# Patient Record
Sex: Male | Born: 2000 | Race: White | Hispanic: No | Marital: Single | Smoking: Never smoker
Health system: Southern US, Community
[De-identification: ages and names within clinical notes are randomized; demographics above are authoritative.]

## PROBLEM LIST (undated history)

## (undated) DIAGNOSIS — I82409 Acute embolism and thrombosis of unspecified deep veins of unspecified lower extremity: Secondary | ICD-10-CM

## (undated) HISTORY — DX: Acute embolism and thrombosis of unspecified deep veins of unspecified lower extremity: I82.409

## (undated) HISTORY — PX: MOUTH SURGERY: SHX715

---
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
## 2018-04-22 DIAGNOSIS — M5416 Radiculopathy, lumbar region: Secondary | ICD-10-CM | POA: Insufficient documentation

## 2018-04-29 ENCOUNTER — Other Ambulatory Visit: Payer: Self-pay | Admitting: Orthopedic Surgery

## 2018-04-29 DIAGNOSIS — R2242 Localized swelling, mass and lump, left lower limb: Secondary | ICD-10-CM

## 2018-04-30 ENCOUNTER — Other Ambulatory Visit: Payer: Self-pay

## 2018-04-30 ENCOUNTER — Ambulatory Visit: Admission: RE | Admit: 2018-04-30 | Payer: BLUE CROSS/BLUE SHIELD | Source: Ambulatory Visit

## 2018-04-30 ENCOUNTER — Inpatient Hospital Stay
Admission: EM | Admit: 2018-04-30 | Discharge: 2018-05-01 | DRG: 271 | Disposition: A | Discharge status: Home / Self Care | Payer: Medicaid Other | Attending: Family Medicine | Admitting: Family Medicine

## 2018-04-30 ENCOUNTER — Inpatient Hospital Stay: Payer: Medicaid Other

## 2018-04-30 ENCOUNTER — Encounter
Admission: EM | Disposition: A | Discharge status: Home / Self Care | Payer: Self-pay | Source: Home / Self Care | Attending: Family Medicine

## 2018-04-30 ENCOUNTER — Inpatient Hospital Stay (HOSPITAL_COMMUNITY)
Admission: EM | Admit: 2018-04-30 | Discharge: 2018-04-30 | Disposition: A | Discharge status: Home / Self Care | Payer: Medicaid Other | Source: Home / Self Care | Attending: Internal Medicine | Admitting: Internal Medicine

## 2018-04-30 DIAGNOSIS — D72829 Elevated white blood cell count, unspecified: Secondary | ICD-10-CM | POA: Diagnosis present

## 2018-04-30 DIAGNOSIS — K529 Noninfective gastroenteritis and colitis, unspecified: Secondary | ICD-10-CM | POA: Diagnosis present

## 2018-04-30 DIAGNOSIS — E663 Overweight: Secondary | ICD-10-CM | POA: Diagnosis present

## 2018-04-30 DIAGNOSIS — Z8249 Family history of ischemic heart disease and other diseases of the circulatory system: Secondary | ICD-10-CM

## 2018-04-30 DIAGNOSIS — M79662 Pain in left lower leg: Secondary | ICD-10-CM

## 2018-04-30 DIAGNOSIS — Z818 Family history of other mental and behavioral disorders: Secondary | ICD-10-CM

## 2018-04-30 DIAGNOSIS — Z7901 Long term (current) use of anticoagulants: Secondary | ICD-10-CM

## 2018-04-30 DIAGNOSIS — Z825 Family history of asthma and other chronic lower respiratory diseases: Secondary | ICD-10-CM | POA: Diagnosis not present

## 2018-04-30 DIAGNOSIS — I82412 Acute embolism and thrombosis of left femoral vein: Secondary | ICD-10-CM | POA: Diagnosis present

## 2018-04-30 DIAGNOSIS — I745 Embolism and thrombosis of iliac artery: Secondary | ICD-10-CM | POA: Diagnosis present

## 2018-04-30 DIAGNOSIS — M7989 Other specified soft tissue disorders: Secondary | ICD-10-CM

## 2018-04-30 DIAGNOSIS — Z821 Family history of blindness and visual loss: Secondary | ICD-10-CM | POA: Diagnosis not present

## 2018-04-30 DIAGNOSIS — I82419 Acute embolism and thrombosis of unspecified femoral vein: Secondary | ICD-10-CM | POA: Diagnosis present

## 2018-04-30 DIAGNOSIS — I82422 Acute embolism and thrombosis of left iliac vein: Secondary | ICD-10-CM

## 2018-04-30 DIAGNOSIS — I340 Nonrheumatic mitral (valve) insufficiency: Secondary | ICD-10-CM

## 2018-04-30 DIAGNOSIS — M79605 Pain in left leg: Secondary | ICD-10-CM

## 2018-04-30 DIAGNOSIS — Z833 Family history of diabetes mellitus: Secondary | ICD-10-CM

## 2018-04-30 DIAGNOSIS — R600 Localized edema: Secondary | ICD-10-CM

## 2018-04-30 DIAGNOSIS — I82439 Acute embolism and thrombosis of unspecified popliteal vein: Secondary | ICD-10-CM | POA: Diagnosis present

## 2018-04-30 DIAGNOSIS — Z823 Family history of stroke: Secondary | ICD-10-CM | POA: Diagnosis not present

## 2018-04-30 DIAGNOSIS — I82432 Acute embolism and thrombosis of left popliteal vein: Secondary | ICD-10-CM

## 2018-04-30 DIAGNOSIS — I82409 Acute embolism and thrombosis of unspecified deep veins of unspecified lower extremity: Secondary | ICD-10-CM | POA: Diagnosis present

## 2018-04-30 HISTORY — PX: PERIPHERAL VASCULAR THROMBECTOMY: CATH118306

## 2018-04-30 LAB — CBC WITH DIFFERENTIAL/PLATELET
ABS IMMATURE GRANULOCYTES: 0.05 10*3/uL (ref 0.00–0.07)
BASOS ABS: 0 10*3/uL (ref 0.0–0.1)
Basophils Relative: 0 %
Eosinophils Absolute: 0.2 10*3/uL (ref 0.0–0.5)
Eosinophils Relative: 2 %
HEMATOCRIT: 46.7 % (ref 39.0–52.0)
Hemoglobin: 15.5 g/dL (ref 13.0–17.0)
IMMATURE GRANULOCYTES: 1 %
LYMPHS ABS: 1.6 10*3/uL (ref 0.7–4.0)
LYMPHS PCT: 15 %
MCH: 28.1 pg (ref 26.0–34.0)
MCHC: 33.2 g/dL (ref 30.0–36.0)
MCV: 84.8 fL (ref 80.0–100.0)
MONOS PCT: 8 %
Monocytes Absolute: 0.9 10*3/uL (ref 0.1–1.0)
NEUTROS ABS: 8.1 10*3/uL — AB (ref 1.7–7.7)
NEUTROS PCT: 74 %
NRBC: 0 % (ref 0.0–0.2)
Platelets: 283 10*3/uL (ref 150–400)
RBC: 5.51 MIL/uL (ref 4.22–5.81)
RDW: 13.2 % (ref 11.5–15.5)
WBC: 10.8 10*3/uL — ABNORMAL HIGH (ref 4.0–10.5)

## 2018-04-30 LAB — COMPREHENSIVE METABOLIC PANEL
ALBUMIN: 4.4 g/dL (ref 3.5–5.0)
ALK PHOS: 83 U/L (ref 38–126)
ALT: 14 U/L (ref 0–44)
ANION GAP: 8 (ref 5–15)
AST: 16 U/L (ref 15–41)
BUN: 18 mg/dL (ref 6–20)
CHLORIDE: 106 mmol/L (ref 98–111)
CO2: 26 mmol/L (ref 22–32)
Calcium: 9 mg/dL (ref 8.9–10.3)
Creatinine, Ser: 0.66 mg/dL (ref 0.61–1.24)
GFR calc Af Amer: >60 mL/min (ref >60)
GFR calc non Af Amer: >60 mL/min (ref >60)
GLUCOSE: 103 mg/dL — AB (ref 70–99)
POTASSIUM: 3.6 mmol/L (ref 3.5–5.1)
SODIUM: 140 mmol/L (ref 135–145)
Total Bilirubin: 1 mg/dL (ref 0.3–1.2)
Total Protein: 8.3 g/dL — ABNORMAL HIGH (ref 6.5–8.1)

## 2018-04-30 LAB — HEMOGLOBIN A1C
Hgb A1c MFr Bld: 5 % (ref 4.8–5.6)
Mean Plasma Glucose: 96.8 mg/dL

## 2018-04-30 LAB — HEPARIN LEVEL (UNFRACTIONATED)
HEPARIN UNFRACTIONATED: 0.19 [IU]/mL — AB (ref 0.30–0.70)
HEPARIN UNFRACTIONATED: 0.31 [IU]/mL (ref 0.30–0.70)

## 2018-04-30 LAB — PROTIME-INR
INR: 1.1
PROTHROMBIN TIME: 14.1 s (ref 11.4–15.2)

## 2018-04-30 LAB — APTT: aPTT: 31 seconds (ref 24–36)

## 2018-04-30 LAB — TSH: TSH: 5.731 u[IU]/mL — ABNORMAL HIGH (ref 0.350–4.500)

## 2018-04-30 SURGERY — PERIPHERAL VASCULAR THROMBECTOMY
Anesthesia: Moderate Sedation | Laterality: Left

## 2018-04-30 MED ORDER — ONDANSETRON HCL 4 MG PO TABS: 4.0000 mg | ORAL_TABLET | Freq: Four times a day (QID) | ORAL | Status: DC | PRN

## 2018-04-30 MED ORDER — FENTANYL CITRATE (PF) 100 MCG/2ML IJ SOLN
INTRAMUSCULAR | Status: DC | PRN
  Administered 2018-04-30 (×2): 50 ug via INTRAVENOUS
  Administered 2018-04-30: 25 ug via INTRAVENOUS
  Administered 2018-04-30: 50 ug via INTRAVENOUS
  Administered 2018-04-30: 25 ug via INTRAVENOUS

## 2018-04-30 MED ORDER — APIXABAN 5 MG PO TABS
10.0000 mg | ORAL_TABLET | Freq: Two times a day (BID) | ORAL | Status: DC
  Administered 2018-05-01: 10 mg via ORAL
  Filled 2018-04-30: qty 2

## 2018-04-30 MED ORDER — MIDAZOLAM HCL 2 MG/2ML IJ SOLN
INTRAMUSCULAR | Status: DC | PRN
  Administered 2018-04-30 (×2): 1 mg via INTRAVENOUS
  Administered 2018-04-30: 2 mg via INTRAVENOUS
  Administered 2018-04-30: 1 mg via INTRAVENOUS

## 2018-04-30 MED ORDER — FENTANYL CITRATE (PF) 100 MCG/2ML IJ SOLN
INTRAMUSCULAR | Status: AC
  Filled 2018-04-30: qty 2

## 2018-04-30 MED ORDER — ONDANSETRON HCL 4 MG/2ML IJ SOLN
INTRAMUSCULAR | Status: AC
  Administered 2018-04-30: 4 mg via INTRAVENOUS
  Filled 2018-04-30: qty 2

## 2018-04-30 MED ORDER — CEFAZOLIN SODIUM-DEXTROSE 1-4 GM/50ML-% IV SOLN
INTRAVENOUS | Status: AC
  Administered 2018-04-30: 1 g via INTRAVENOUS
  Filled 2018-04-30: qty 50

## 2018-04-30 MED ORDER — ONDANSETRON HCL 4 MG/2ML IJ SOLN: 4.0000 mg | Freq: Four times a day (QID) | INTRAMUSCULAR | Status: DC | PRN

## 2018-04-30 MED ORDER — DOCUSATE SODIUM 100 MG PO CAPS
100.0000 mg | ORAL_CAPSULE | Freq: Two times a day (BID) | ORAL | Status: DC
  Administered 2018-04-30 – 2018-05-01 (×2): 100 mg via ORAL
  Filled 2018-04-30 (×2): qty 1

## 2018-04-30 MED ORDER — MIDAZOLAM HCL 5 MG/5ML IJ SOLN
INTRAMUSCULAR | Status: AC
  Filled 2018-04-30: qty 5

## 2018-04-30 MED ORDER — OXYCODONE-ACETAMINOPHEN 5-325 MG PO TABS
ORAL_TABLET | ORAL | Status: AC
  Filled 2018-04-30: qty 1

## 2018-04-30 MED ORDER — CEFAZOLIN SODIUM-DEXTROSE 2-4 GM/100ML-% IV SOLN
2.0000 g | INTRAVENOUS | Status: DC
  Filled 2018-04-30: qty 100

## 2018-04-30 MED ORDER — ACETAMINOPHEN 325 MG PO TABS
650.0000 mg | ORAL_TABLET | Freq: Four times a day (QID) | ORAL | Status: DC | PRN
  Administered 2018-04-30 – 2018-05-01 (×2): 650 mg via ORAL
  Filled 2018-04-30 (×2): qty 2

## 2018-04-30 MED ORDER — SODIUM CHLORIDE 0.9 % IV SOLN: INTRAVENOUS | Status: DC

## 2018-04-30 MED ORDER — IOPAMIDOL (ISOVUE-300) INJECTION 61%
INTRAVENOUS | Status: DC | PRN
  Administered 2018-04-30: 50 mL via INTRA_ARTERIAL

## 2018-04-30 MED ORDER — HEPARIN (PORCINE) IN NACL 1000-0.9 UT/500ML-% IV SOLN
INTRAVENOUS | Status: AC
  Filled 2018-04-30: qty 1000

## 2018-04-30 MED ORDER — OXYCODONE-ACETAMINOPHEN 5-325 MG PO TABS
1.0000 | ORAL_TABLET | Freq: Four times a day (QID) | ORAL | Status: DC | PRN
  Administered 2018-04-30 – 2018-05-01 (×3): 1 via ORAL
  Filled 2018-04-30 (×2): qty 1

## 2018-04-30 MED ORDER — ACETAMINOPHEN 650 MG RE SUPP: 650.0000 mg | Freq: Four times a day (QID) | RECTAL | Status: DC | PRN

## 2018-04-30 MED ORDER — ONDANSETRON HCL 4 MG/2ML IJ SOLN
4.0000 mg | Freq: Once | INTRAMUSCULAR | Status: AC
  Administered 2018-04-30: 4 mg via INTRAVENOUS

## 2018-04-30 MED ORDER — MORPHINE SULFATE (PF) 4 MG/ML IV SOLN
8.0000 mg | Freq: Once | INTRAVENOUS | Status: AC
  Administered 2018-04-30: 8 mg via INTRAVENOUS

## 2018-04-30 MED ORDER — HEPARIN BOLUS VIA INFUSION
5700.0000 [IU] | Freq: Once | INTRAVENOUS | Status: AC
  Administered 2018-04-30: 5700 [IU] via INTRAVENOUS
  Filled 2018-04-30: qty 5700

## 2018-04-30 MED ORDER — MORPHINE SULFATE (PF) 2 MG/ML IV SOLN
2.0000 mg | INTRAVENOUS | Status: DC | PRN
  Administered 2018-04-30 – 2018-05-01 (×2): 2 mg via INTRAVENOUS
  Filled 2018-04-30 (×2): qty 1

## 2018-04-30 MED ORDER — SODIUM CHLORIDE 0.9 % IV SOLN
INTRAVENOUS | Status: DC
  Administered 2018-04-30 – 2018-05-01 (×3): via INTRAVENOUS

## 2018-04-30 MED ORDER — STERILE WATER FOR INJECTION IJ SOLN
INTRAMUSCULAR | Status: AC
  Filled 2018-04-30: qty 20

## 2018-04-30 MED ORDER — LIDOCAINE-EPINEPHRINE (PF) 1 %-1:200000 IJ SOLN
INTRAMUSCULAR | Status: AC
  Filled 2018-04-30: qty 30

## 2018-04-30 MED ORDER — MORPHINE SULFATE (PF) 4 MG/ML IV SOLN
INTRAVENOUS | Status: AC
  Administered 2018-04-30: 8 mg via INTRAVENOUS
  Filled 2018-04-30: qty 1

## 2018-04-30 MED ORDER — HEPARIN (PORCINE) 25000 UT/250ML-% IV SOLN
1600.0000 [IU]/h | INTRAVENOUS | Status: DC
  Administered 2018-04-30 (×2): 1600 [IU]/h via INTRAVENOUS
  Filled 2018-04-30 (×3): qty 250

## 2018-04-30 MED ORDER — INFLUENZA VAC SPLIT QUAD 0.5 ML IM SUSY: 0.5000 mL | PREFILLED_SYRINGE | INTRAMUSCULAR | Status: DC

## 2018-04-30 MED ORDER — HEPARIN SODIUM (PORCINE) 1000 UNIT/ML IJ SOLN
INTRAMUSCULAR | Status: AC
  Filled 2018-04-30: qty 1

## 2018-04-30 MED ORDER — CEFAZOLIN SODIUM-DEXTROSE 1-4 GM/50ML-% IV SOLN
1.0000 g | INTRAVENOUS | Status: AC
  Administered 2018-04-30: 1 g via INTRAVENOUS

## 2018-04-30 SURGICAL SUPPLY — 17 items
BALLN ULTRVRSE 8X100X75 (BALLOONS) ×3
BALLOON ULTRVRSE 8X100X75 (BALLOONS) ×1 IMPLANT
CANISTER PENUMBRA ENGINE (MISCELLANEOUS) ×3 IMPLANT
CANNULA 5F STIFF (CANNULA) ×3 IMPLANT
CATH BEACON 5 .038 100 VERT TP (CATHETERS) ×3 IMPLANT
CATH INDIGO CAT8 TORQ 85 KIT (CATHETERS) ×3 IMPLANT
CATH INDIGO SEP 8 (CATHETERS) ×3 IMPLANT
DEVICE PRESTO INFLATION (MISCELLANEOUS) ×3 IMPLANT
DEVICE SAFEGUARD 24CM (GAUZE/BANDAGES/DRESSINGS) ×3 IMPLANT
GLIDEWIRE ADV .035X180CM (WIRE) ×3 IMPLANT
PACK ANGIOGRAPHY (CUSTOM PROCEDURE TRAY) ×3 IMPLANT
SHEATH 9FRX11 (SHEATH) ×3 IMPLANT
SHEATH BRITE TIP 8FRX11 (SHEATH) ×3 IMPLANT
SYR MEDRAD MARK V 150ML (SYRINGE) ×3 IMPLANT
TUBING CONTRAST HIGH PRESS 72 (TUBING) ×3 IMPLANT
WIRE J 3MM .035X145CM (WIRE) ×3 IMPLANT
WIRE MAGIC TORQUE 260C (WIRE) ×3 IMPLANT

## 2018-04-30 NOTE — Progress Notes (Signed)
ANTICOAGULATION CONSULT NOTE - Initial Consult  Pharmacy Consult for heparin drip Indication: DVT  No Known Allergies  Patient Measurements: Height: 6' (182.9 cm) Weight: 217 lb 1.6 oz (98.5 kg) IBW/kg (Calculated) : 77.6 Heparin Dosing Weight: 95 kg  Vital Signs: Temp: 98.8 F (37.1 C) (11/14 1941) Temp Source: Oral (11/14 1941) BP: 109/56 (11/14 1941) Pulse Rate: 92 (11/14 1941)  Labs: Recent Labs    04/30/18 0412 04/30/18 0457 04/30/18 1521 04/30/18 2008  HGB 15.5  --   --   --   HCT 46.7  --   --   --   PLT 283  --   --   --   APTT  --  31  --   --   LABPROT  --  14.1  --   --   INR  --  1.10  --   --   HEPARINUNFRC  --   --  0.19* 0.31  CREATININE 0.66  --   --   --     Estimated Creatinine Clearance: 180.7 mL/min (by C-G formula based on SCr of 0.66 mg/dL).   Medical History: History reviewed. No pertinent past medical history.  Medications:  No anticoag in PTA meds.  Assessment: Heparin was stopped at 11/14 @1244 . Heparin level returned at 0.19. Heparin gtt was restarted at 11/14 @1350 . Heparin level was obtained 1521. Level was drawn too early to make adjustments. Currently running at a rate of 1600 units/hr.   11/14 @2008 : Heparin level 0.31.   Goal of Therapy:  Heparin level 0.3-0.7 units/ml Monitor platelets by anticoagulation protocol: Yes   Plan:  Within goal. Will continue with the current rate.  Will order a 6 hour heparin level 6 hours post heparin gtt starting and adjust from there.   Ronnald RampKishan S Avanthika Dehnert, PharmD  Clinical Pharmacist 04/30/2018,8:39 PM

## 2018-04-30 NOTE — Op Note (Signed)
[facility]   OPERATIVE NOTE   PRE-OPERATIVE DIAGNOSIS: extensive Left leg DVT, colitis, previous trauma to the left leg many years ago  POST-OPERATIVE DIAGNOSIS: same   PROCEDURE: 1. US guidance for vascular access to right femoral vein and left lesser saphenous vein 2. Catheter placement into right iliac vein from right femoral approach and into left iliac vein from left lesser saphenous approach 3. IVC gram and bilateral lower extremity venogram 4.   Catheter directed thrombolysis with 12 mg of TPA to the left popliteal, superficial femoral, common femoral, and iliac veins  6. Mechanical thrombectomy to the left popliteal, superficial femoral, common femoral, and iliac veins 7. PTA of the left iliac vein with 8 mm diameter angioplasty balloon   SURGEON: Leotis Pain, MD  ASSISTANT(S): none  ANESTHESIA: local with moderate conscious sedation for 60 minutes using 5 mg of Versed and 200 Mcg of Fentanyl  ESTIMATED BLOOD LOSS: 500 cc  FINDING(S): 1. 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  There did appear to be a somewhat small IVC when injections were performed through the right femoral sheath but this was not well seen from the left.  Extensive thrombus from the occluded left iliac vein all the way down to the left popliteal vein.  SPECIMEN(S): none  INDICATIONS:  Patient is a 17 y.o. male who presents with massive left leg swelling and pain and an extensive left lower extremity DVT. Patient has marked leg swelling and pain. Venous intervention is performed to reduce the symtpoms and avoid long term postphlebitic symptoms.   DESCRIPTION: After obtaining full informed written consent, the patient was brought back to the vascular suite and placed supine upon the table.Moderate conscious sedation was administered during a face to face encounter with the patient throughout the procedure with my supervision of  the RN administering medicines and monitoring the patient's vital signs, pulse oximetry, telemetry and mental status throughout from the start of the procedure until the patient was taken to the recovery room. After obtaining adequate anesthesia, the patient was prepped and draped in the standard fashion. The right femoral vein was then accessed under US guidance and found to be widely patent. It was accessed without difficulty and a permanent image was recorded. I then tried to advance a wire but it would not pass beyond what would appear to be the proximal right common iliac vein.  Imaging performed through the right femoral sheath demonstrated what appeared to be occlusion of the right iliac vein with extensive collaterals.  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  There was not a direct action from the right iliac vein to this apparent IVC.  For this reason, no IVC filter was placed. The patient was then placed into the prone position. The left lesser saphenous vein was then accessed under direct ultrasound guidance without difficulty with a micropuncture needle and a permanent image was recorded. I then upsized to an 9Fr sheath over a J wire. Imaging showed extensive DVT with minimal flow. A Kumpe catheter and Magic tourque wire were then advanced into the CFV and images were performed. There was clearly occlusion of the iliac vein with essentially no forward flow on initial imaging. I then used the penumbra cat 8 catheter and instilled 12 mg of tpa throughout the left popliteal, superficial femoral, common femoral, and iliac veins.  After this dwelled, I  used the Penumbra Cat 8 catheter and evacuated about 250 to 300 cc of effluent with mechanical thrombectomy throughout the iliac veins, CFV, SFV, and popliteal vein. Large amounts of thrombus were removed with this.  This had mild improvement in the forward flow  and significant amount of thrombus remained. I then made several more passes with the penumbra cat 8 catheter using the separator pulling back a large amount of thrombus.  Another 200 to 250 cc of effluent were returned in the return of clot was a very large amount.  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  However, we really did not see the IVC particularly well.  Using an advantage wire and a Kumpe catheter advanced up to what we had the appearance of the IVC, but really did not have much flow when injections were performed through the Kumpe catheter.  I elected to gently treat the left common iliac vein occlusion with an 8 mm diameter by 10 cm length angioplasty balloon inflated to 8 atm for 1 minute.  This did result in some forward flow although it was not really into the IVC and more through pelvic collaterals.  The amount of thrombus returned had been dramatic and I felt we had done as much as we could at this setting to improve his venous circulation.  I then elected to terminate the procedure. The sheath was removed and a dressing was placed. He was taken to the recovery room in stable condition having tolerated the procedure well.   COMPLICATIONS: None  CONDITION: Stable  Leotis Pain 04/30/2018 1:48 PM

## 2018-04-30 NOTE — Consult Note (Signed)
[facility] Vascular Consult Note  MRN : [id number]  Corey Malone. is a 17 y.o. (2001/05/20) male who presents with chief complaint of No chief complaint on file.  History of Present Illness:  The patient is a 17 year old male with no significant past medical history who presented to the [facility] emergency department with a chief complaint of left lower extremity "pain and swelling".  The patient endorses a progressively worsening 2 to 3-day.  Of increasing swelling and discomfort to the left lower extremity.  The patient describes his pain as a "throbbing" which is made worse when standing or ambulating.  The patient denies any recent trauma to the left lower extremity.  He does endorse a history of trauma to the left leg approximately 8 years ago however has not experienced anything recently.  The patient denies any erythema or ulceration to the left lower extremity.  The patient denies any pallor or paresthesia to the left lower extremity.  Patient denies any shortness of breath or chest pain.  Patient denies any fever, nausea vomiting.  During his work-up in the emergency department the patient underwent a left lower extremity venous duplex which was notable for "Extensive deep venous thrombosis throughout the LEFT lower extremity."  Vascular surgery was consulted by Dr. Sheryle Hail in regard to further recommendations including the possibility of venous lysis. Current Facility-Administered Medications  Medication Dose Route Frequency Provider Last Rate Last Dose  . 0.9 %  sodium chloride infusion   Intravenous Continuous Arnaldo Natal, MD 100 mL/hr at 04/30/18 [id number]    . 0.9 %  sodium chloride infusion   Intravenous Continuous Dew, Marlow Baars, MD      . Mitzi Hansen Hold] acetaminophen (TYLENOL) tablet 650 mg  650 mg Oral Q6H PRN Arnaldo Natal, MD       Or  . Mitzi Hansen Hold] acetaminophen (TYLENOL) suppository 650 mg  650 mg Rectal Q6H PRN  Arnaldo Natal, MD      . ceFAZolin (ANCEF) IVPB 2g/100 mL premix  2 g Intravenous 30 min Pre-Op Annice Needy, MD      . Mitzi Hansen Hold] docusate sodium (COLACE) capsule 100 mg  100 mg Oral BID Arnaldo Natal, MD      . heparin ADULT infusion 100 units/mL (25000 units/253mL sodium chloride 0.45%)  1,600 Units/hr Intravenous Continuous Arnaldo Natal, MD 16 mL/hr at 04/30/18 0655 1,600 Units/hr at 04/30/18 0655  . [MAR Hold] morphine 2 MG/ML injection 2 mg  2 mg Intravenous Q4H PRN Arnaldo Natal, MD      . Mitzi Hansen Hold] ondansetron [facility]) tablet 4 mg  4 mg Oral Q6H PRN Arnaldo Natal, MD       Or  . Mitzi Hansen Hold] ondansetron [facility]) injection 4 mg  4 mg Intravenous Q6H PRN Arnaldo Natal, MD      . Mitzi Hansen Hold] oxyCODONE-acetaminophen (PERCOCET/ROXICET) 5-325 MG per tablet 1 tablet  1 tablet Oral Q6H PRN Arnaldo Natal, MD   1 tablet at 04/30/18 1191   History reviewed. No pertinent past medical history.  Past Surgical History:  Procedure Laterality Date  . MOUTH SURGERY     Social History Social History   Tobacco Use  . Smoking status: Never Smoker  . Smokeless tobacco: Never Used  Substance Use Topics  . Alcohol use: Not on file  . Drug use: Not on file   Family History Family History  Problem Relation Age of Onset  .  Diabetes Mellitus II Father   . Stroke Maternal Uncle   The patient denies any family history of peripheral artery disease, venous disease or bleeding/clotting disorders.  No Known Allergies  REVIEW OF SYSTEMS (Negative unless checked)  Constitutional: [] Weight loss  [] Fever  [] Chills Cardiac: [] Chest pain   [] Chest pressure   [] Palpitations   [] Shortness of breath when laying flat   [] Shortness of breath at rest   [] Shortness of breath with exertion. Vascular:  [x] Pain in legs with walking   [x] Pain in legs at rest   [x] Pain in legs when laying flat   [] Claudication   [x] Pain in feet when walking  [] Pain in feet at rest  [x] Pain in feet when  laying flat   [] History of DVT   [] Phlebitis   [x] Swelling in legs   [] Varicose veins   [] Non-healing ulcers Pulmonary:   [] Uses home oxygen   [] Productive cough   [] Hemoptysis   [] Wheeze  [] COPD   [] Asthma Neurologic:  [] Dizziness  [] Blackouts   [] Seizures   [] History of stroke   [] History of TIA  [] Aphasia   [] Temporary blindness   [] Dysphagia   [] Weakness or numbness in arms   [] Weakness or numbness in legs Musculoskeletal:  [] Arthritis   [] Joint swelling   [] Joint pain   [] Low back pain Hematologic:  [] Easy bruising  [] Easy bleeding   [] Hypercoagulable state   [] Anemic  [] Hepatitis Gastrointestinal:  [] Blood in stool   [] Vomiting blood  [] Gastroesophageal reflux/heartburn   [] Difficulty swallowing. Genitourinary:  [] Chronic kidney disease   [] Difficult urination  [] Frequent urination  [] Burning with urination   [] Blood in urine Skin:  [] Rashes   [] Ulcers   [] Wounds Psychological:  [] History of anxiety   []  History of major depression.  Physical Examination  Vitals:   04/30/18 0626 04/30/18 0925 04/30/18 0926 04/30/18 1057  BP: 123/76 111/62  (!) 124/91  Pulse: 93 72 68 95  Resp: 16   11  Temp: 98 F (36.7 C)  97.8 F (36.6 C) 97.8 F (36.6 C)  TempSrc: Oral  Oral Oral  SpO2: 100% 99% 100% 99%  Weight: 98.5 kg     Height: 6' (1.829 m)      Body mass index is 29.44 kg/m. Gen:  WD/WN, NAD Head: [place]/AT, No temporalis wasting. Prominent temp pulse not noted. Ear/Nose/Throat: Hearing grossly intact, nares w/o erythema or drainage, oropharynx w/o Erythema/Exudate Eyes: Sclera non-icteric, conjunctiva clear Neck: Trachea midline.  No JVD.  Pulmonary:  Good air movement, respirations not labored, equal bilaterally.  Cardiac: RRR, normal S1, S2. Vascular:  Vessel Right Left  Radial Palpable Palpable  Ulnar Palpable Palpable  Brachial Palpable Palpable  Carotid Palpable, without bruit Palpable, without bruit  Aorta Not palpable N/A  Femoral Palpable Palpable  Popliteal Palpable  Non-Palpable  PT Palpable Non-Palpable  DP Palpable Non-Palpable   Left lower extremity: Severe edema noted to the lower left extremity.  Hard to palpate pedal pulses due to edema.  Good capillary refill.  There is no acute vascular compromise noted to the extremity.  Skin is intact.  No cellulitis.  Gastrointestinal: soft, non-tender/non-distended. No guarding/reflex.  Musculoskeletal: M/S 5/5 throughout.  Extremities without ischemic changes.  No deformity or atrophy.  Neurologic: Sensation grossly intact in extremities.  Symmetrical.  Speech is fluent. Motor exam as listed above. Psychiatric: Judgment intact, Mood & affect appropriate for pt's clinical situation. Dermatologic: No rashes or ulcers noted.  No cellulitis or open wounds. Lymph : No Cervical, Axillary, or Inguinal lymphadenopathy.  CBC Lab Results  Component Value Date   WBC 10.8 (H) 04/30/2018   HGB 15.5 04/30/2018   HCT 46.7 04/30/2018   MCV 84.8 04/30/2018   PLT 283 04/30/2018   BMET    Component Value Date/Time   NA 140 04/30/2018 0412   K 3.6 04/30/2018 0412   CL 106 04/30/2018 0412   CO2 26 04/30/2018 0412   GLUCOSE 103 (H) 04/30/2018 0412   BUN 18 04/30/2018 0412   CREATININE 0.66 04/30/2018 0412   CALCIUM 9.0 04/30/2018 0412   GFRNONAA >60 04/30/2018 0412   GFRAA >60 04/30/2018 0412   Estimated Creatinine Clearance: 180.7 mL/min (by C-G formula based on SCr of 0.66 mg/dL).  COAG Lab Results  Component Value Date   INR 1.10 04/30/2018   Radiology US Venous Img Lower Unilateral Left  Result Date: 04/30/2018 CLINICAL DATA:  Pain and swelling LEFT lower leg EXAM: LEFT LOWER EXTREMITY VENOUS DOPPLER ULTRASOUND TECHNIQUE: Gray-scale sonography with graded compression, as well as color Doppler and duplex ultrasound were performed to evaluate the lower extremity deep venous systems from the level of the common femoral vein and including the common femoral, femoral, profunda femoral, popliteal and calf  veins including the posterior tibial, peroneal and gastrocnemius veins when visible. The superficial great saphenous vein was also interrogated. Spectral Doppler was utilized to evaluate flow at rest and with distal augmentation maneuvers in the common femoral, femoral and popliteal veins. COMPARISON:  None FINDINGS: Contralateral Common Femoral Vein: No evidence of thrombus. Normal compressibility, respiratory phasicity and response to augmentation. Common Femoral Vein: Filled with hypoechoic thrombus. Impaired compressibility. Absent spontaneous venous flow. Saphenofemoral Junction: Filled with hypoechoic thrombus. Impaired compressibility. Absent spontaneous venous flow. Profunda Femoral Vein: Filled with hypoechoic thrombus. Impaired compressibility. Absent spontaneous venous flow. Femoral Vein: Filled with hypoechoic thrombus. Impaired compressibility. Absent spontaneous venous flow. Popliteal Vein: Filled with hypoechoic thrombus. Impaired compressibility. Absent spontaneous venous flow. Calf Veins: Inadequately visualized due to LEFT calf edema. Superficial Great Saphenous Vein: Not evaluated Venous Reflux:  N/A Other Findings:  None. IMPRESSION: Extensive deep venous thrombosis throughout the LEFT lower extremity. Report concurs with preliminary report issued by Dr. Cherly Hensen on 04/30/2018 at 0425 hours. Electronically Signed   By: Ulyses Southward M.D.   On: 04/30/2018 07:12   Assessment/Plan The patient is a 17 year old male with no significant past medical history who presented to the [facility] emergency department with 2 to 3 days of worsening left lower extremity edema and discomfort.  The patient was found to have an extensive left lower extremity DVT - Stable 1.  Left lower extremity DVT: Do to the extensive nature of the patient's DVT, his young age and his active baseline activity level would recommend a left lower extremity venous lysis in an attempt to remove some clot burden  and improve the patient's symptoms.  Procedure, risks and benefits explained to the patient.  All questions answered.  The patient wishes to proceed.  2.  IVC filter placement: This will be placed during the venous lysis however will need to be removed in approximately 6 to 8 weeks.  The patient understands this. 3.  Lower extremity edema: Recommend compression and elevation to improve the patient's edema and associated discomfort. 4.  Would recommend the patient being transitioned from heparin to Eliquis 5 mg p.o. twice daily upon discharge.  The patient will need to be on anticoagulation for at least 6 months.  The patient also expresses understanding.  Discussed with Dr. Weldon Inches, PA-C  04/30/2018 12:13 PM  This note was created with Dragon medical transcription system.  Any error is purely unintentional.

## 2018-04-30 NOTE — Progress Notes (Signed)
*  PRELIMINARY RESULTS* Echocardiogram 2D Echocardiogram has been performed.  Cristela BlueHege, Arayla Kruschke 04/30/2018, 2:40 PM
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
## 2018-04-30 NOTE — Progress Notes (Signed)
ANTICOAGULATION CONSULT NOTE - Initial Consult  Pharmacy Consult for heparin drip Indication: DVT  No Known Allergies  Patient Measurements: Height: 6' (182.9 cm) Weight: 217 lb 1.6 oz (98.5 kg) IBW/kg (Calculated) : 77.6 Heparin Dosing Weight: 95 kg  Vital Signs: Temp: 98.2 F (36.8 C) (11/14 1553) Temp Source: Oral (11/14 1553) BP: 108/56 (11/14 1554) Pulse Rate: 86 (11/14 1554)  Labs: Recent Labs    04/30/18 0412 04/30/18 0457 04/30/18 1521  HGB 15.5  --   --   HCT 46.7  --   --   PLT 283  --   --   APTT  --  31  --   LABPROT  --  14.1  --   INR  --  1.10  --   HEPARINUNFRC  --   --  0.19*  CREATININE 0.66  --   --     Estimated Creatinine Clearance: 180.7 mL/min (by C-G formula based on SCr of 0.66 mg/dL).   Medical History: History reviewed. No pertinent past medical history.  Medications:  No anticoag in PTA meds.  Assessment: Heparin was stopped at 11/14 @1244 . Heparin level returned at 0.19. Heparin gtt was restarted at 11/14 @1350 . Heparin level was obtained 1521. Level was drawn too early to make adjustments. Currently running at a rate of 1600 units/hr.   Goal of Therapy:  Heparin level 0.3-0.7 units/ml Monitor platelets by anticoagulation protocol: Yes   Plan:  Will order a 6 hour heparin level 6 hours post heparin gtt starting and adjust from there.   Ronnald RampKishan S Artyom Stencel, PharmD  Clinical Pharmacist 04/30/2018,5:09 PM
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
## 2018-04-30 NOTE — Progress Notes (Signed)
ANTICOAGULATION CONSULT NOTE - Initial Consult  Pharmacy Consult for heparin drip Indication: DVT  Not on File  Patient Measurements: Height: 6' (182.9 cm) Weight: 210 lb (95.3 kg) IBW/kg (Calculated) : 77.6 Heparin Dosing Weight: 95 kg  Vital Signs:    Labs: Recent Labs    04/30/18 0412  HGB 15.5  HCT 46.7  PLT 283  CREATININE 0.66    Estimated Creatinine Clearance: 177.9 mL/min (by C-G formula based on SCr of 0.66 mg/dL).   Medical History: No past medical history on file.  Medications:  No anticoag in PTA meds.  Assessment:  Goal of Therapy:  Heparin level 0.3-0.7 units/ml Monitor platelets by anticoagulation protocol: Yes   Plan:  5700 unit bolus and initial rate of 1600 units/hr. First heparin level 6 hours after start of infusion.  Corey Malone S 04/30/2018,5:22 AM

## 2018-05-01 LAB — HEPARIN LEVEL (UNFRACTIONATED): Heparin Unfractionated: 0.36 IU/mL (ref 0.30–0.70)

## 2018-05-01 MED ORDER — OXYCODONE-ACETAMINOPHEN 5-325 MG PO TABS: 1.0000 | ORAL_TABLET | Freq: Four times a day (QID) | ORAL | 0 refills | Status: DC | PRN

## 2018-05-01 MED ORDER — ONDANSETRON HCL 4 MG PO TABS: 4.0000 mg | ORAL_TABLET | Freq: Four times a day (QID) | ORAL | 0 refills | Status: DC | PRN

## 2018-05-01 MED ORDER — MORPHINE SULFATE (PF) 2 MG/ML IV SOLN: 2.0000 mg | INTRAVENOUS | 0 refills | Status: DC | PRN

## 2018-05-01 MED ORDER — APIXABAN 5 MG PO TABS: 10.0000 mg | ORAL_TABLET | Freq: Two times a day (BID) | ORAL | Status: DC

## 2018-05-01 MED ORDER — DOCUSATE SODIUM 100 MG PO CAPS: 100.0000 mg | ORAL_CAPSULE | Freq: Two times a day (BID) | ORAL | 0 refills | Status: DC

## 2018-05-01 MED ORDER — APIXABAN (ELIQUIS) EDUCATION KIT FOR DVT/PE PATIENTS: 1.0000 | PACK | Freq: Once | 0 refills | Status: AC

## 2018-05-01 MED ORDER — TRAMADOL HCL 50 MG PO TABS: 50.0000 mg | ORAL_TABLET | Freq: Four times a day (QID) | ORAL | 0 refills | Status: DC | PRN

## 2018-05-01 MED ORDER — ACETAMINOPHEN 325 MG PO TABS: 650.0000 mg | ORAL_TABLET | Freq: Four times a day (QID) | ORAL | 0 refills | Status: DC | PRN

## 2018-05-01 NOTE — Progress Notes (Signed)
ANTICOAGULATION CONSULT NOTE - Initial Consult  Pharmacy Consult for heparin drip Indication: DVT  No Known Allergies  Patient Measurements: Height: 6' (182.9 cm) Weight: 217 lb 1.6 oz (98.5 kg) IBW/kg (Calculated) : 77.6 Heparin Dosing Weight: 95 kg  Vital Signs: Temp: 98.8 F (37.1 C) (11/14 1941) Temp Source: Oral (11/14 1941) BP: 109/56 (11/14 1941) Pulse Rate: 92 (11/14 1941)  Labs: Recent Labs    04/30/18 0412 04/30/18 0457 04/30/18 1521 04/30/18 2008 05/01/18 0230  HGB 15.5  --   --   --   --   HCT 46.7  --   --   --   --   PLT 283  --   --   --   --   APTT  --  31  --   --   --   LABPROT  --  14.1  --   --   --   INR  --  1.10  --   --   --   HEPARINUNFRC  --   --  0.19* 0.31 0.36  CREATININE 0.66  --   --   --   --     Estimated Creatinine Clearance: 180.7 mL/min (by C-G formula based on SCr of 0.66 mg/dL).   Medical History: History reviewed. No pertinent past medical history.  Medications:  No anticoag in PTA meds.  Assessment: Heparin was stopped at 11/14 @1244 . Heparin level returned at 0.19. Heparin gtt was restarted at 11/14 @1350 . Heparin level was obtained 1521. Level was drawn too early to make adjustments. Currently running at a rate of 1600 units/hr.   11/14 @2008 : Heparin level 0.31.   Goal of Therapy:  Heparin level 0.3-0.7 units/ml Monitor platelets by anticoagulation protocol: Yes   Plan:  Within goal. Will continue with the current rate.  Will order a 6 hour heparin level 6 hours post heparin gtt starting and adjust from there.   11/15 0230 heparin level 0.36. Continue current regimen. Recheck heparin level and CBC with tomorrow AM labs.  Erich MontaneMcBane,Ryer Asato S, PharmD  Clinical Pharmacist 05/01/2018,3:27 AM

## 2018-05-01 NOTE — Discharge Instructions (Signed)
Remove your left lower leg wrap Saturday. Please wear compression sock. Please elevate your legs as much as possible. Heart level.

## 2018-05-01 NOTE — Plan of Care (Signed)
Pt. Is resting comfortably with left leg elevated.  Dressing is clean, dry, and intact.  IV heparin continues to infuse. Pt. Is resting comfortably with adequate pain control.

## 2018-05-01 NOTE — Progress Notes (Signed)
Corey Malone.  A and O x 4. VSS. Pt tolerating diet well. No complaints of pain or nausea. IV removed intact, prescriptions given. Pt voiced understanding of discharge instructions with no further questions. Pt discharged via wheelchair with axillary.    Allergies as of 05/01/2018   No Known Allergies     Medication List    STOP taking these medications   predniSONE 5 MG (21) Tbpk tablet Commonly known as:  STERAPRED UNI-PAK 21 TAB     TAKE these medications   acetaminophen 325 MG tablet Commonly known as:  TYLENOL Take 2 tablets (650 mg total) by mouth every 6 (six) hours as needed for mild pain (or Fever >/= 101).   apixaban Kit Commonly known as:  ELIQUIS 1 kit by Does not apply route once for 1 dose. 10 mg twice daily for 7 days, then 5 mg twice daily thereafter for 6 month course   cyclobenzaprine 10 MG tablet Commonly known as:  FLEXERIL Take 10 mg by mouth 3 (three) times daily.   docusate sodium 100 MG capsule Commonly known as:  COLACE Take 1 capsule (100 mg total) by mouth 2 (two) times daily.   naproxen 500 MG tablet Commonly known as:  NAPROSYN Take 1,000 mg by mouth daily as needed.   ondansetron 4 MG tablet Commonly known as:  ZOFRAN Take 1 tablet (4 mg total) by mouth every 6 (six) hours as needed for nausea.   traMADol 50 MG tablet Commonly known as:  ULTRAM Take 1 tablet (50 mg total) by mouth every 6 (six) hours as needed for severe pain.       Vitals:   05/01/18 0450 05/01/18 1232  BP: (!) 117/58 111/69  Pulse: 84 79  Resp: 16 14  Temp: 98.8 F (37.1 C) 98.6 F (37 C)  SpO2: 97% 100%    Francesco Sor

## 2018-05-01 NOTE — Care Management (Signed)
Patient discharged home today.  Patient lives at home with his parents.  Mother is at bedside.  Patient to discharge on eliquis.  RNCM confirmed that [facility]  Has Eliquis in stock.  Patient to pick up rx for Elqius at [facility]  After discharge.  Patient also provided 30 day free voucher for Eliquis.  Patient's mother states that patient will have health insurance on 06/17/18.  Mother rather patient be set up at one of the local PCP offices vs [facility]  Or [facility].  Patient has [facility]  Patient is aware that his out of pocket copay will be $125 at the time of the visit.  Patient and mother are aware that should they needed medical treatment sooner the patient can visit [facility] or the ED if indicated.  Patient also has a follow up appointment with Dr. Wyn Quakerew.  Mother provided application to [facility]  And [facility]
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
## 2018-05-04 ENCOUNTER — Telehealth (INDEPENDENT_AMBULATORY_CARE_PROVIDER_SITE_OTHER): Payer: Self-pay

## 2018-05-04 NOTE — Telephone Encounter (Signed)
Patient's Mom called and stated that after the procedure to remove the blood clot, he can still not put any weight on his leg, and he was told that by Saturday he should be up and walking around.  He was hoping to be able to go back to school today, but he still can't walk on that leg. What should they do?

## 2018-05-04 NOTE — Telephone Encounter (Signed)
Spoke to the patient's Mom and let her know what was advised and let her know that he can have Tylenol or Ibuprofen for his pain and to be sure to alternate between the two to help control his pain.

## 2018-05-13 ENCOUNTER — Other Ambulatory Visit (INDEPENDENT_AMBULATORY_CARE_PROVIDER_SITE_OTHER): Payer: Self-pay | Admitting: Vascular Surgery

## 2018-05-13 DIAGNOSIS — Z86718 Personal history of other venous thrombosis and embolism: Secondary | ICD-10-CM

## 2018-05-19 ENCOUNTER — Encounter (INDEPENDENT_AMBULATORY_CARE_PROVIDER_SITE_OTHER): Payer: Self-pay | Admitting: Nurse Practitioner

## 2018-05-19 ENCOUNTER — Ambulatory Visit (INDEPENDENT_AMBULATORY_CARE_PROVIDER_SITE_OTHER): Payer: Self-pay

## 2018-05-19 ENCOUNTER — Ambulatory Visit (INDEPENDENT_AMBULATORY_CARE_PROVIDER_SITE_OTHER): Payer: MEDICAID | Admitting: Nurse Practitioner

## 2018-05-19 VITALS — BP 145/70 | HR 68 | Resp 17 | Ht 72.0 in | Wt 218.0 lb

## 2018-05-19 DIAGNOSIS — I82412 Acute embolism and thrombosis of left femoral vein: Secondary | ICD-10-CM

## 2018-05-19 DIAGNOSIS — Z86718 Personal history of other venous thrombosis and embolism: Secondary | ICD-10-CM

## 2018-05-19 MED ORDER — APIXABAN 5 MG PO TABS: 5.0000 mg | ORAL_TABLET | Freq: Two times a day (BID) | ORAL | 4 refills | Status: DC
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
## 2018-06-24 ENCOUNTER — Telehealth: Payer: Self-pay | Admitting: Pharmacy Technician

## 2018-06-24 NOTE — Telephone Encounter (Signed)
Patient failed to provide proof of income.  No additional medication assistance will be provided by [facility] without the required proof of income documentation.  Patient notified by letter.  Sahand Gosch J. Beverlyann Broxterman Care Manager [facility] 

## 2018-11-16 ENCOUNTER — Telehealth (INDEPENDENT_AMBULATORY_CARE_PROVIDER_SITE_OTHER): Payer: Self-pay | Admitting: Vascular Surgery

## 2018-11-16 NOTE — Telephone Encounter (Signed)
A message was left earlier today to let them know that the paperwork is for them to fill out regarding his insurance. Questions are based on the patient's home life and will need to be answered by the patient. There isn't anything for a provider to fill out. The message that was left also states the paperwork will be at the front desk for pick up. Spoke with Darel Hong, the patient's mother and explained all of this and she stated she will call the insurance company and find out exactly what needs to be done and for me to shred the paperwork because she has another copy.

## 2018-11-19 ENCOUNTER — Encounter (INDEPENDENT_AMBULATORY_CARE_PROVIDER_SITE_OTHER): Payer: MEDICAID

## 2018-11-19 ENCOUNTER — Ambulatory Visit (INDEPENDENT_AMBULATORY_CARE_PROVIDER_SITE_OTHER): Payer: MEDICAID | Admitting: Nurse Practitioner

## 2018-12-13 ENCOUNTER — Other Ambulatory Visit (INDEPENDENT_AMBULATORY_CARE_PROVIDER_SITE_OTHER): Payer: Self-pay | Admitting: Nurse Practitioner

## 2018-12-14 ENCOUNTER — Telehealth (INDEPENDENT_AMBULATORY_CARE_PROVIDER_SITE_OTHER): Payer: Self-pay

## 2018-12-14 NOTE — Telephone Encounter (Signed)
Receive a refill request for Eliquis 5mg  bid #60 with 4 refills and I spoke with patient mother to see if patient will be returning back to the office since the last visit was cancel.The mother informed that she is waiting on a response from [facility] to continue service with our office since there is not vascular doctor within 25 miles.Per Eulogio Ditch NP I called in Eliquis 5mg  #60 bid with 2 refills into pharmacy on patient chart and for the to continue receiving medication he will need to schedule appointment with dvt study

## 2019-03-17 ENCOUNTER — Ambulatory Visit (INDEPENDENT_AMBULATORY_CARE_PROVIDER_SITE_OTHER): Payer: BLUE CROSS/BLUE SHIELD

## 2019-03-17 ENCOUNTER — Encounter (INDEPENDENT_AMBULATORY_CARE_PROVIDER_SITE_OTHER): Payer: Self-pay | Admitting: Nurse Practitioner

## 2019-03-17 ENCOUNTER — Ambulatory Visit (INDEPENDENT_AMBULATORY_CARE_PROVIDER_SITE_OTHER): Payer: BLUE CROSS/BLUE SHIELD | Admitting: Nurse Practitioner

## 2019-03-17 ENCOUNTER — Other Ambulatory Visit: Payer: Self-pay

## 2019-03-17 VITALS — BP 102/72 | HR 96 | Resp 16 | Wt 229.8 lb

## 2019-03-17 DIAGNOSIS — I82412 Acute embolism and thrombosis of left femoral vein: Secondary | ICD-10-CM | POA: Diagnosis not present

## 2019-03-17 NOTE — Progress Notes (Signed)
SUBJECTIVE:  Patient ID: Corey Look., male    DOB: 02-28-2001, 18 y.o.   MRN: [id number] Chief Complaint  Patient presents with  . Follow-up    ultrasound follow up    HPI  Corey Hallinan. is a 18 y.o. male that presents today for follow-up after having a extensive DVT within the left lower extremity.  The patient underwent thrombectomy on 04/30/2018 of the left popliteal, superficial femoral, common femoral and iliac veins.  No IVC filter was placed at that time.    The patient does report having some edema however he is very consistent with wearing his medical grade 1 compression stockings.  The patient also elevates as much as possible.  Endorses some pain here and there however nothing that cannot be handled with Tylenol.  He exercises daily.  He denies any fever, chills, nausea, vomiting or diarrhea.  Today the patient has no evidence of DVT or superficial venous thrombosis bilaterally.  History reviewed. No pertinent past medical history.  Past Surgical History:  Procedure Laterality Date  . MOUTH SURGERY    . PERIPHERAL VASCULAR THROMBECTOMY Left 04/30/2018   Procedure: PERIPHERAL VASCULAR THROMBECTOMY;  Surgeon: Algernon Huxley, MD;  Location: [facility];  Service: Cardiovascular;  Laterality: Left;    Social History   Socioeconomic History  . Marital status: Single    Spouse name: Not on file  . Number of children: Not on file  . Years of education: Not on file  . Highest education level: Not on file  Occupational History  . Not on file  Social Needs  . Financial resource strain: Not on file  . Food insecurity    Worry: Not on file    Inability: Not on file  . Transportation needs    Medical: Not on file    Non-medical: Not on file  Tobacco Use  . Smoking status: Never Smoker  . Smokeless tobacco: Never Used  Substance and Sexual Activity  . Alcohol use: Not on file  . Drug use: Not on file  . Sexual activity: Not on file   Lifestyle  . Physical activity    Days per week: Not on file    Minutes per session: Not on file  . Stress: Not on file  Relationships  . Social Herbalist on phone: Not on file    Gets together: Not on file    Attends religious service: Not on file    Active member of club or organization: Not on file    Attends meetings of clubs or organizations: Not on file    Relationship status: Not on file  . Intimate partner violence    Fear of current or ex partner: Not on file    Emotionally abused: Not on file    Physically abused: Not on file    Forced sexual activity: Not on file  Other Topics Concern  . Not on file  Social History Narrative  . Not on file    Family History  Problem Relation Age of Onset  . Diabetes Mellitus II Father   . Stroke Maternal Uncle     No Known Allergies   Review of Systems   Review of Systems: Negative Unless Checked Constitutional: [] Weight loss  [] Fever  [] Chills Cardiac: [] Chest pain   []  Atrial Fibrillation  [] Palpitations   [] Shortness of breath when laying flat   [] Shortness of breath with exertion. [] Shortness of breath at rest Vascular:  [] Pain  in legs with walking   [] Pain in legs with standing [] Pain in legs when laying flat   [] Claudication    [] Pain in feet when laying flat    [x] History of DVT   [] Phlebitis   [] Swelling in legs   [] Varicose veins   [] Non-healing ulcers Pulmonary:   [] Uses home oxygen   [] Productive cough   [] Hemoptysis   [] Wheeze  [] COPD   [] Asthma Neurologic:  [x] Dizziness   [] Seizures  [] Blackouts [] History of stroke   [] History of TIA  [] Aphasia   [] Temporary Blindness   [] Weakness or numbness in arm   [] Weakness or numbness in leg Musculoskeletal:   [] Joint swelling   [] Joint pain   [] Low back pain  []  History of Knee Replacement [] Arthritis [] back Surgeries  []  Spinal Stenosis    Hematologic:  [] Easy bruising  [] Easy bleeding   [] Hypercoagulable state   [] Anemic Gastrointestinal:  [] Diarrhea   [] Vomiting   [] Gastroesophageal reflux/heartburn   [] Difficulty swallowing. [] Abdominal pain Genitourinary:  [] Chronic kidney disease   [] Difficult urination  [] Anuric   [] Blood in urine [] Frequent urination  [] Burning with urination   [] Hematuria Skin:  [] Rashes   [] Ulcers [] Wounds Psychological:  [] History of anxiety   []  History of major depression  []  Memory Difficulties      OBJECTIVE:   Physical Exam  BP 102/72 (BP Location: Right Arm)   Pulse 96   Resp 16   Wt 229 lb 12.8 oz (104.2 kg)   BMI 31.17 kg/m   Gen: WD/WN, NAD Head: /AT, No temporalis wasting.  Ear/Nose/Throat: Hearing grossly intact, nares w/o erythema or drainage Eyes: PER, EOMI, sclera nonicteric.  Neck: Supple, no masses.  No JVD.  Pulmonary:  Good air movement, no use of accessory muscles.  Cardiac: RRR Vascular:  Minimal edema bilaterally Vessel Right Left  Radial Palpable Palpable  Dorsalis Pedis Palpable Palpable  Posterior Tibial Palpable Palpable   Gastrointestinal: soft, non-distended. No guarding/no peritoneal signs.  Musculoskeletal: M/S 5/5 throughout.  No deformity or atrophy.  Neurologic: Pain and light touch intact in extremities.  Symmetrical.  Speech is fluent. Motor exam as listed above. Psychiatric: Judgment intact, Mood & affect appropriate for pt's clinical situation. Dermatologic: No Venous rashes. No Ulcers Noted.  No changes consistent with cellulitis. Lymph : No Cervical lymphadenopathy, no lichenification or skin changes of chronic lymphedema.       ASSESSMENT AND PLAN:  1. Acute deep vein thrombosis (DVT) of femoral vein of left lower extremity (HCC) Previous DVT has resolved.  Patient advised to stop Eliquis.  Patient advised to continue her medical grade 1 compression stockings on a daily basis.  He is also been instructed to elevate his lower extremities as much as possible.  We talked about postphlebitic syndrome as well as treatment with medical grade 1 compression stockings and  NSAIDs are helpful for this.  Patient will continue to follow-up with on an as-needed basis.   Current Outpatient Medications on File Prior to Visit  Medication Sig Dispense Refill  . acetaminophen (TYLENOL) 325 MG tablet Take 2 tablets (650 mg total) by mouth every 6 (six) hours as needed for mild pain (or Fever >/= 101). (Patient not taking: Reported on 03/17/2019) 60 tablet 0  . cyclobenzaprine (FLEXERIL) 10 MG tablet Take 10 mg by mouth 3 (three) times daily.  0  . docusate sodium (COLACE) 100 MG capsule Take 1 capsule (100 mg total) by mouth 2 (two) times daily. (Patient not taking: Reported on 05/19/2018) 10 capsule 0  .  ELIQUIS STARTER PACK (ELIQUIS STARTER PACK) 5 MG TABS TAKE 2 TABLETS BY MOUTH TWO TIMES DAILY FOR 7 DAYS, THEN 1 TAB BY MOUTH TWO TIMES DAILY THEREAFTER  0  . naproxen (NAPROSYN) 500 MG tablet Take 1,000 mg by mouth daily as needed.    . ondansetron (ZOFRAN) 4 MG tablet Take 1 tablet (4 mg total) by mouth every 6 (six) hours as needed for nausea. (Patient not taking: Reported on 05/19/2018) 20 tablet 0  . traMADol (ULTRAM) 50 MG tablet Take 1 tablet (50 mg total) by mouth every 6 (six) hours as needed for severe pain. (Patient not taking: Reported on 05/19/2018) 10 tablet 0   No current facility-administered medications on file prior to visit.     There are no Patient Instructions on file for this visit. No follow-ups on file.   Georgiana SpinnerFallon E Brown, NP  This note was completed with Office managerDragon Dictation.  Any errors are purely unintentional.

## 2019-09-10 ENCOUNTER — Ambulatory Visit: Payer: 59 | Attending: Internal Medicine

## 2019-09-10 DIAGNOSIS — Z23 Encounter for immunization: Secondary | ICD-10-CM

## 2019-09-10 NOTE — Progress Notes (Signed)
   [id number]  Name:  Corey Malone.    MRN: [id number] DOB: Jul 04, 2000  09/10/2019  Mr. Roper was observed post Covid-19 immunization for 15 minutes without incident. He was provided with Vaccine Information Sheet and instruction to access the V-Safe system.   Mr. Seeley was instructed to call 911 with any severe reactions post vaccine: Marland Kitchen Difficulty breathing  . Swelling of face and throat  . A fast heartbeat  . A bad rash all over body  . Dizziness and weakness   Immunizations Administered    Name Date Dose VIS Date Route   Pfizer COVID-19 Vaccine 09/10/2019 12:41 PM 0.3 mL 05/28/2019 Intramuscular   Manufacturer: [employer], [employer]   Lot: [id number]   NDC: [address]-7

## 2019-10-05 ENCOUNTER — Ambulatory Visit: Payer: 59 | Attending: Internal Medicine

## 2019-10-05 DIAGNOSIS — Z23 Encounter for immunization: Secondary | ICD-10-CM

## 2019-10-05 NOTE — Progress Notes (Signed)
   [id number]  Name:  Dorothy Landgrebe.    MRN: [id number] DOB: 2001/02/08  10/05/2019  Mr. Selsor was observed post Covid-19 immunization for 15 minutes without incident. He was provided with Vaccine Information Sheet and instruction to access the V-Safe system.   Mr. Laban was instructed to call 911 with any severe reactions post vaccine: Marland Kitchen Difficulty breathing  . Swelling of face and throat  . A fast heartbeat  . A bad rash all over body  . Dizziness and weakness   Immunizations Administered    Name Date Dose VIS Date Route   Pfizer COVID-19 Vaccine 10/05/2019 12:21 PM 0.3 mL 08/11/2018 Intramuscular   Manufacturer: [employer], [employer]   Lot: [id number]   NDC: [address]-2

## 2020-02-06 IMAGING — US US EXTREM LOW VENOUS*L*
1 series · 13 of 24 positions shown · non-contrast
Comparison: None

CLINICAL DATA: Pain and swelling LEFT lower leg



[Series 1: us extrem low venous*left* · 0.07mm/px · 13 of 27 slices shown]
[im 1/27]
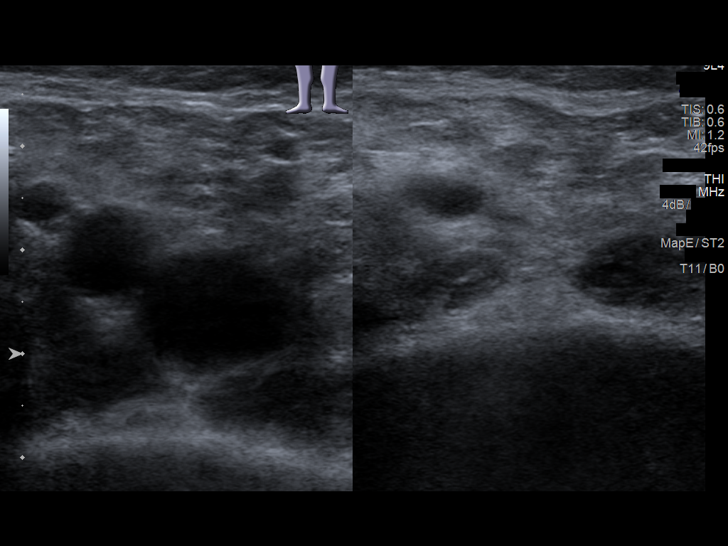
[im 3/27]
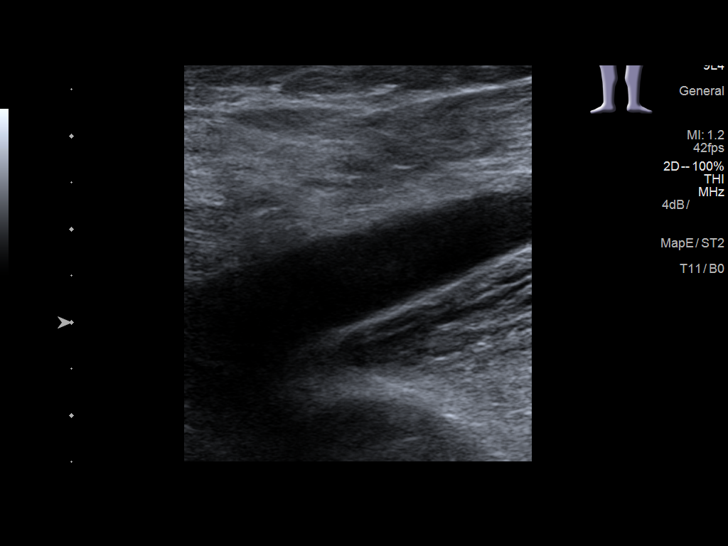
[im 5/27]
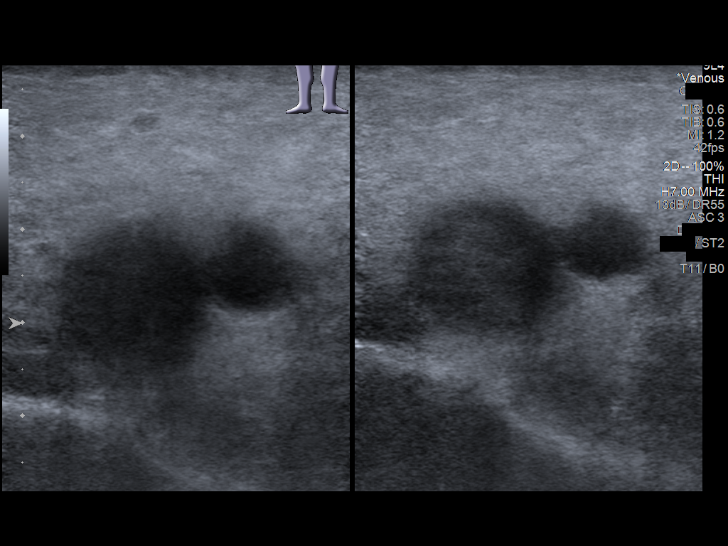
[im 7/27]
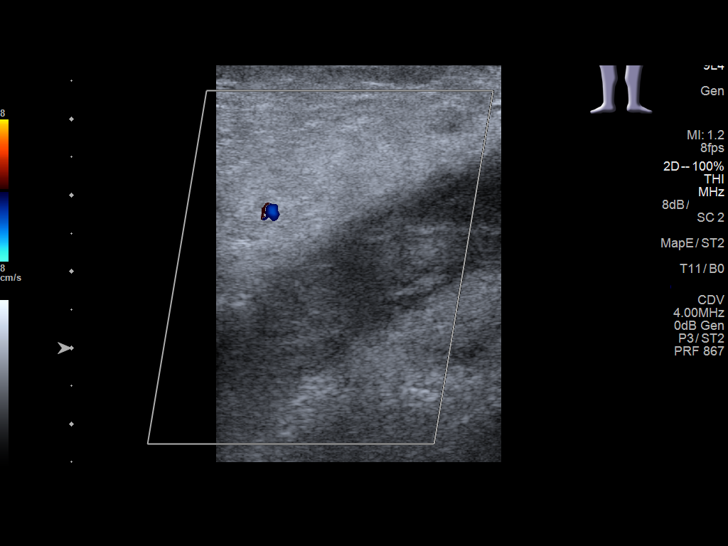
[im 10/27]
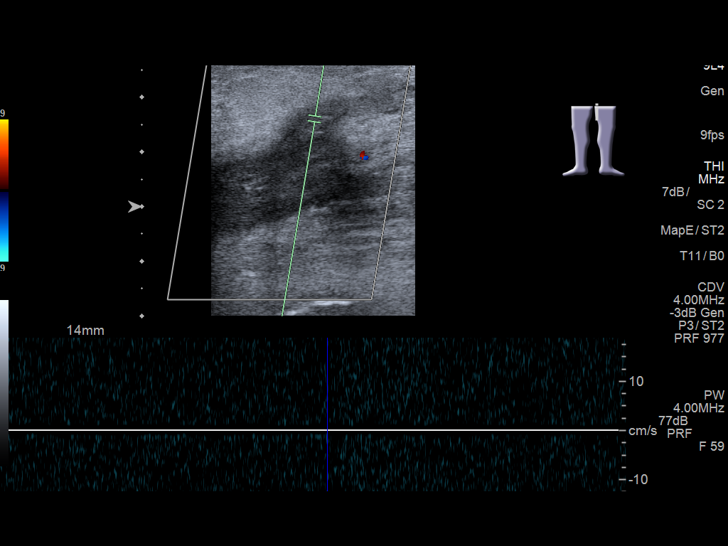
[im 12/27]
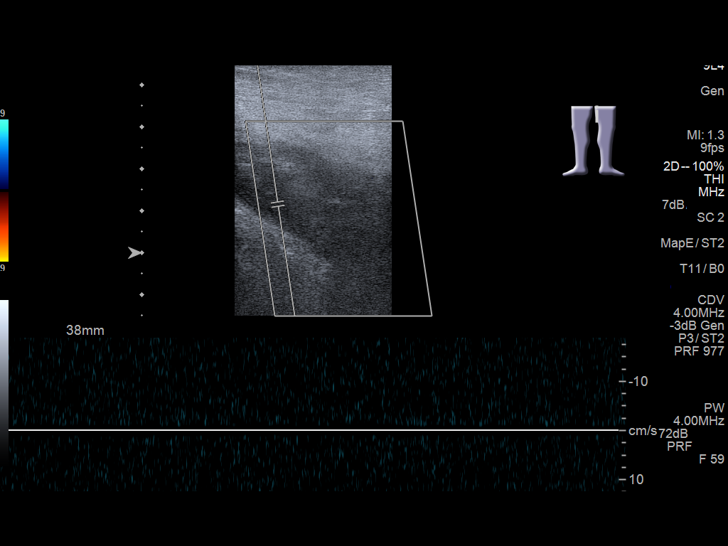
[im 14/27]
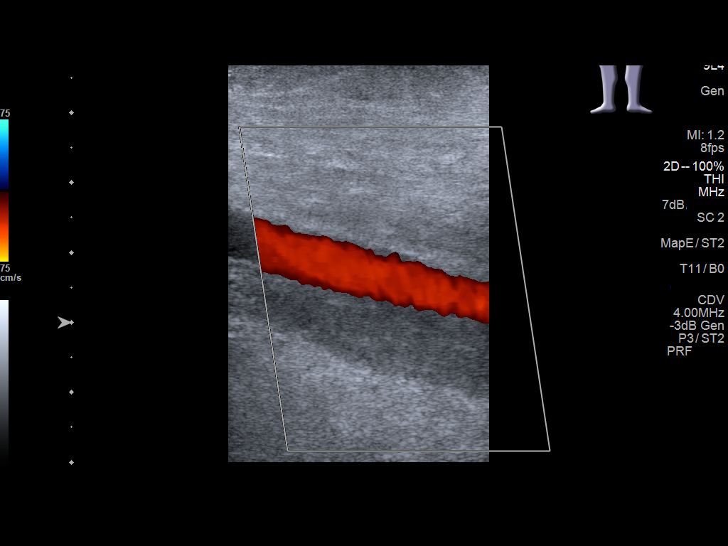
[im 15/27]
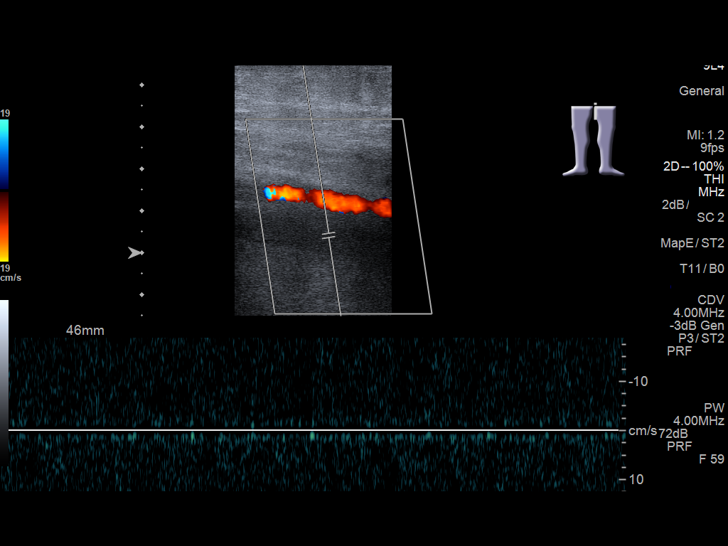
[im 17/27]
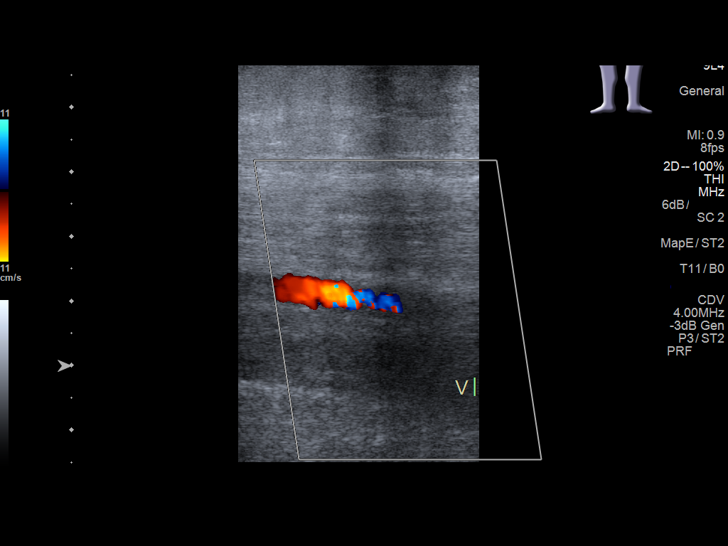
[im 20/27]
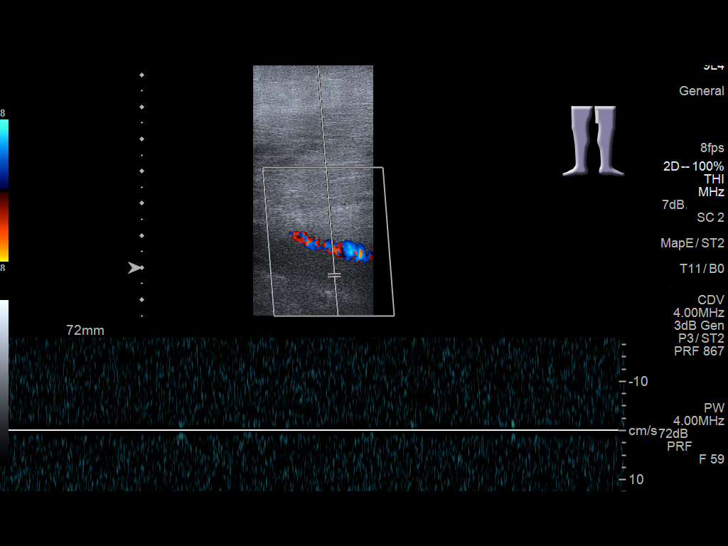
[im 22/27]
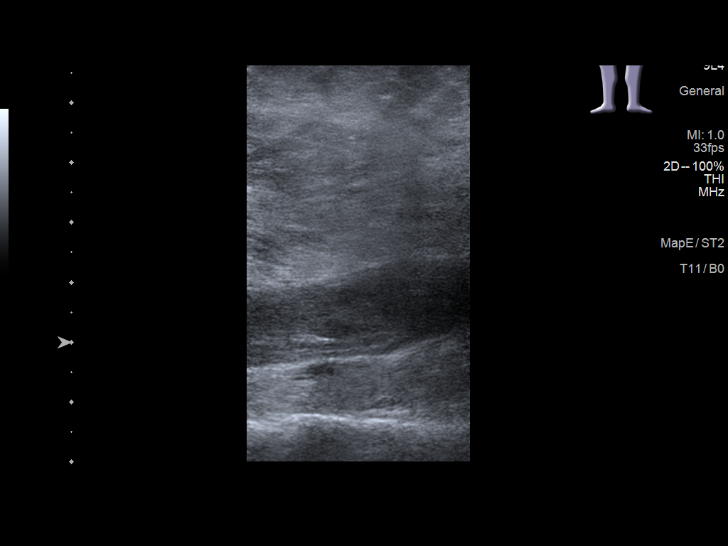
[im 24/27]
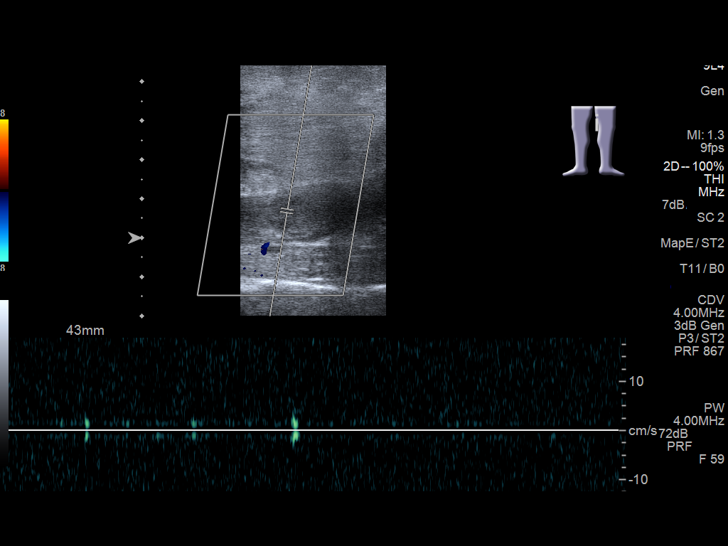
[im 27/27]
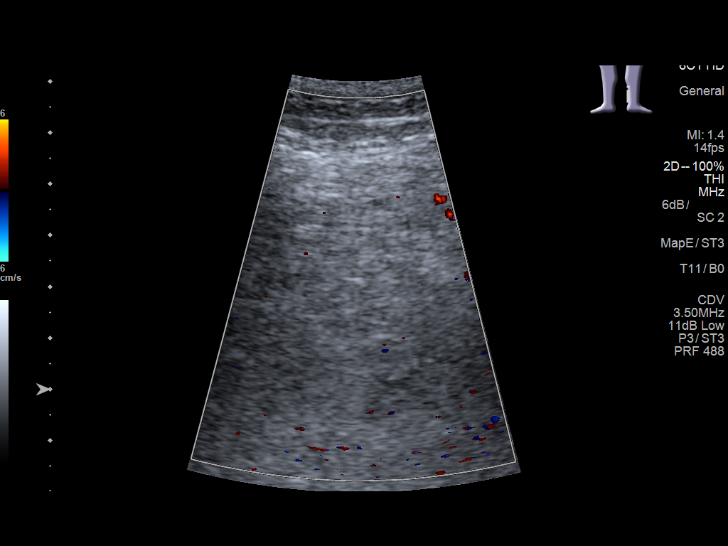

[13 of 24 positions shown; findings below may reference images not displayed]

FINDINGS: Contralateral Common Femoral Vein: No evidence of thrombus. Normal
compressibility, respiratory phasicity and response to augmentation.

Common Femoral Vein: Filled with hypoechoic thrombus. Impaired
compressibility. Absent spontaneous venous flow.

Saphenofemoral Junction: Filled with hypoechoic thrombus. Impaired
compressibility. Absent spontaneous venous flow.

Profunda Femoral Vein: Filled with hypoechoic thrombus. Impaired
compressibility. Absent spontaneous venous flow.

Femoral Vein: Filled with hypoechoic thrombus. Impaired
compressibility. Absent spontaneous venous flow.

Popliteal Vein: Filled with hypoechoic thrombus. Impaired
compressibility. Absent spontaneous venous flow.

Calf Veins: Inadequately visualized due to LEFT calf edema.

Superficial Great Saphenous Vein: Not evaluated

Venous Reflux:  N/A

Other Findings:  None.
IMPRESSION: Extensive deep venous thrombosis throughout the LEFT lower
extremity.

Report concurs with preliminary report issued by Dr. Marroquin on

## 2020-03-21 ENCOUNTER — Encounter: Payer: Self-pay | Admitting: Internal Medicine

## 2020-03-21 ENCOUNTER — Ambulatory Visit
Admission: RE | Admit: 2020-03-21 | Discharge: 2020-03-21 | Disposition: A | Discharge status: Home / Self Care | Payer: 59 | Source: Ambulatory Visit | Attending: Internal Medicine | Admitting: Internal Medicine

## 2020-03-21 ENCOUNTER — Ambulatory Visit (INDEPENDENT_AMBULATORY_CARE_PROVIDER_SITE_OTHER): Payer: 59 | Admitting: Internal Medicine

## 2020-03-21 ENCOUNTER — Ambulatory Visit
Admission: RE | Admit: 2020-03-21 | Discharge: 2020-03-21 | Disposition: A | Discharge status: Home / Self Care | Payer: 59 | Attending: Internal Medicine | Admitting: Internal Medicine

## 2020-03-21 ENCOUNTER — Other Ambulatory Visit: Payer: Self-pay

## 2020-03-21 VITALS — BP 122/88 | HR 87 | Ht 72.0 in | Wt 260.0 lb

## 2020-03-21 DIAGNOSIS — R0789 Other chest pain: Secondary | ICD-10-CM | POA: Insufficient documentation

## 2020-03-21 DIAGNOSIS — K529 Noninfective gastroenteritis and colitis, unspecified: Secondary | ICD-10-CM | POA: Diagnosis not present

## 2020-03-21 DIAGNOSIS — I82409 Acute embolism and thrombosis of unspecified deep veins of unspecified lower extremity: Secondary | ICD-10-CM

## 2020-03-21 NOTE — Progress Notes (Signed)
Date:  03/21/2020   Name:  Corey Malone.   DOB:  05-Jun-2001   MRN:  [id number]   Chief Complaint: Establish Care and Chest Pain (Wakes up with chest pain on and off every morning. Starting [place] of last year. Starting with right sided pain and went to chest. [facility] said EKG was fine. )  Chest Pain  This is a chronic problem. The problem occurs intermittently. The problem has been gradually worsening. The pain is present in the substernal region and lateral region (on right). The pain is at a severity of 2/10. The pain is mild. The quality of the pain is described as pressure. The pain does not radiate. Associated symptoms include back pain. Pertinent negatives include no abdominal pain, cough, dizziness, fever, headaches, palpitations or shortness of breath. Associated symptoms comments: Chest wall abnormality .  Rectal Bleeding  The problem occurs rarely. The problem has been unchanged. The patient is experiencing no pain. The stool is described as soft. Treatments tried: budesonide for colitis. Associated symptoms include chest pain. Pertinent negatives include no fever, no abdominal pain, no diarrhea, no headaches and no coughing.   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  Treated with Eliquis for 6 months then stopped.  Second DVT in the same leg 6 months later.  Repeat thrombectomy at [facility] and Eliquis resumed for life.  Lab Results  Component Value Date   CREATININE 0.66 04/30/2018   BUN 18 04/30/2018   NA 140 04/30/2018   K 3.6 04/30/2018   CL 106 04/30/2018   CO2 26 04/30/2018   No results found for: CHOL, HDL, LDLCALC, LDLDIRECT, TRIG, CHOLHDL Lab Results  Component Value Date   TSH 5.731 (H) 04/30/2018   Lab Results  Component Value Date   HGBA1C 5.0 04/30/2018   Lab Results  Component Value Date   WBC 10.8 (H) 04/30/2018   HGB 15.5 04/30/2018   HCT 46.7 04/30/2018   MCV 84.8 04/30/2018   PLT 283 04/30/2018   Lab Results    Component Value Date   ALT 14 04/30/2018   AST 16 04/30/2018   ALKPHOS 83 04/30/2018   BILITOT 1.0 04/30/2018     Review of Systems  Constitutional: Negative for chills, fatigue and fever.  Respiratory: Negative for cough, chest tightness, shortness of breath and wheezing.   Cardiovascular: Positive for chest pain. Negative for palpitations and leg swelling.  Gastrointestinal: Positive for anal bleeding (intermittent) and hematochezia. Negative for abdominal pain, constipation and diarrhea.  Musculoskeletal: Positive for back pain.  Neurological: Negative for dizziness, light-headedness and headaches.  Psychiatric/Behavioral: Negative for dysphoric mood and sleep disturbance. The patient is not nervous/anxious.     Patient Active Problem List   Diagnosis Date Noted  . 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 Lumbar radiculopathy 04/22/2018  . Colitis 01/02/2016  . 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 MOUTH SURGERY    . PERIPHERAL VASCULAR THROMBECTOMY Left 04/30/2018   Procedure: PERIPHERAL VASCULAR THROMBECTOMY;  Surgeon: Annice Needy, MD;  Location: [facility];  Service: Cardiovascular;  Laterality: Left;    Social History   Tobacco Use  . Smoking status: Never Smoker  . Smokeless tobacco: Never Used  Vaping Use  . Vaping Use: Former  . Substances: Nicotine, Flavoring  Substance Use Topics  . Alcohol use: Never  Comment: RARE  . Drug use: Yes    Types: Marijuana     Medication list has been reviewed and updated.  Current Meds  Medication Sig  . apixaban (ELIQUIS) 5 MG TABS tablet Take 5 mg by mouth 2 (two) times daily.    PHQ 2/9 Scores 03/21/2020  PHQ - 2 Score 0  PHQ- 9 Score 4    GAD 7 : Generalized Anxiety Score 03/21/2020  Nervous, Anxious, on Edge 1  Control/stop worrying 0  Worry  too much - different things 0  Trouble relaxing 0  Restless 0  Easily annoyed or irritable 0  Afraid - awful might happen 0  Total GAD 7 Score 1  Anxiety Difficulty Not difficult at all    BP Readings from Last 3 Encounters:  03/21/20 122/88  03/17/19 102/72  05/19/18 (!) 145/70    Physical Exam Vitals and nursing note reviewed.  Constitutional:      General: He is not in acute distress.    Appearance: He is well-developed. He is obese.  HENT:     Head: Normocephalic and atraumatic.  Cardiovascular:     Heart sounds: Normal heart sounds.  Pulmonary:     Effort: Pulmonary effort is normal. No respiratory distress.     Breath sounds: Normal breath sounds.  Chest:     Chest wall: Deformity (right anterior ribs prominent) present.  Abdominal:     General: Bowel sounds are normal.     Palpations: Abdomen is soft.     Tenderness: There is no abdominal tenderness.  Musculoskeletal:        General: Normal range of motion.     Right lower leg: No tenderness.     Left lower leg: No tenderness.  Skin:    General: Skin is warm and dry.     Findings: No rash.  Neurological:     Mental Status: He is alert and oriented to person, place, and time.  Psychiatric:        Behavior: Behavior normal.        Thought Content: Thought content normal.     Wt Readings from Last 3 Encounters:  03/21/20 260 lb (117.9 kg)  03/17/19 229 lb 12.8 oz (104.2 kg)  05/19/18 218 lb (98.9 kg) (97 %, Z= 1.82)*   * Growth percentiles are based on CDC (Boys, 2-20 Years) data.    BP 122/88   Pulse 87   Ht 6' (1.829 m)   Wt 260 lb (117.9 kg)   SpO2 97%   BMI 35.26 kg/m   Assessment and Plan: 1. Recurrent acute deep vein thrombosis (DVT) of lower extremity, unspecified laterality (HCC) Continue compression stockings Continue Eliquis bid  Follow up with [facility] as planned  2. Chest wall pain Will obtain CXR to better define the bony abnormality Recommend that he try different sleep  positions to reduce the discomfort he feels every morning - DG Chest 2 View; Future  3. Colitis Treated with Budesonide in the past No recurrent symptoms except for occasional BRB on the tissue.   Partially dictated using Animal nutritionist. Any errors are unintentional.  Bari Edward, MD [facility]  03/21/2020

## 2020-03-22 ENCOUNTER — Telehealth: Payer: Self-pay

## 2020-03-22 NOTE — Telephone Encounter (Signed)
Called and spoke with pt. Informed of normal Xray.   CM

## 2020-03-22 NOTE — Telephone Encounter (Unsigned)
Copied from CRM [id number]. Topic: Appointment Scheduling - Scheduling Inquiry for Clinic >> Mar 22, 2020 12:46 PM Crist Infante wrote: Reason for CRM: pt returning call about xray results.  Please call back

## 2021-06-17 IMAGING — CR DG CHEST 2V
3 series · 3 of 3 positions shown · non-contrast
Comparison: None.

CLINICAL DATA: Anterior chest pain

EXAM:
CHEST - 2 VIEW

[chest pa]
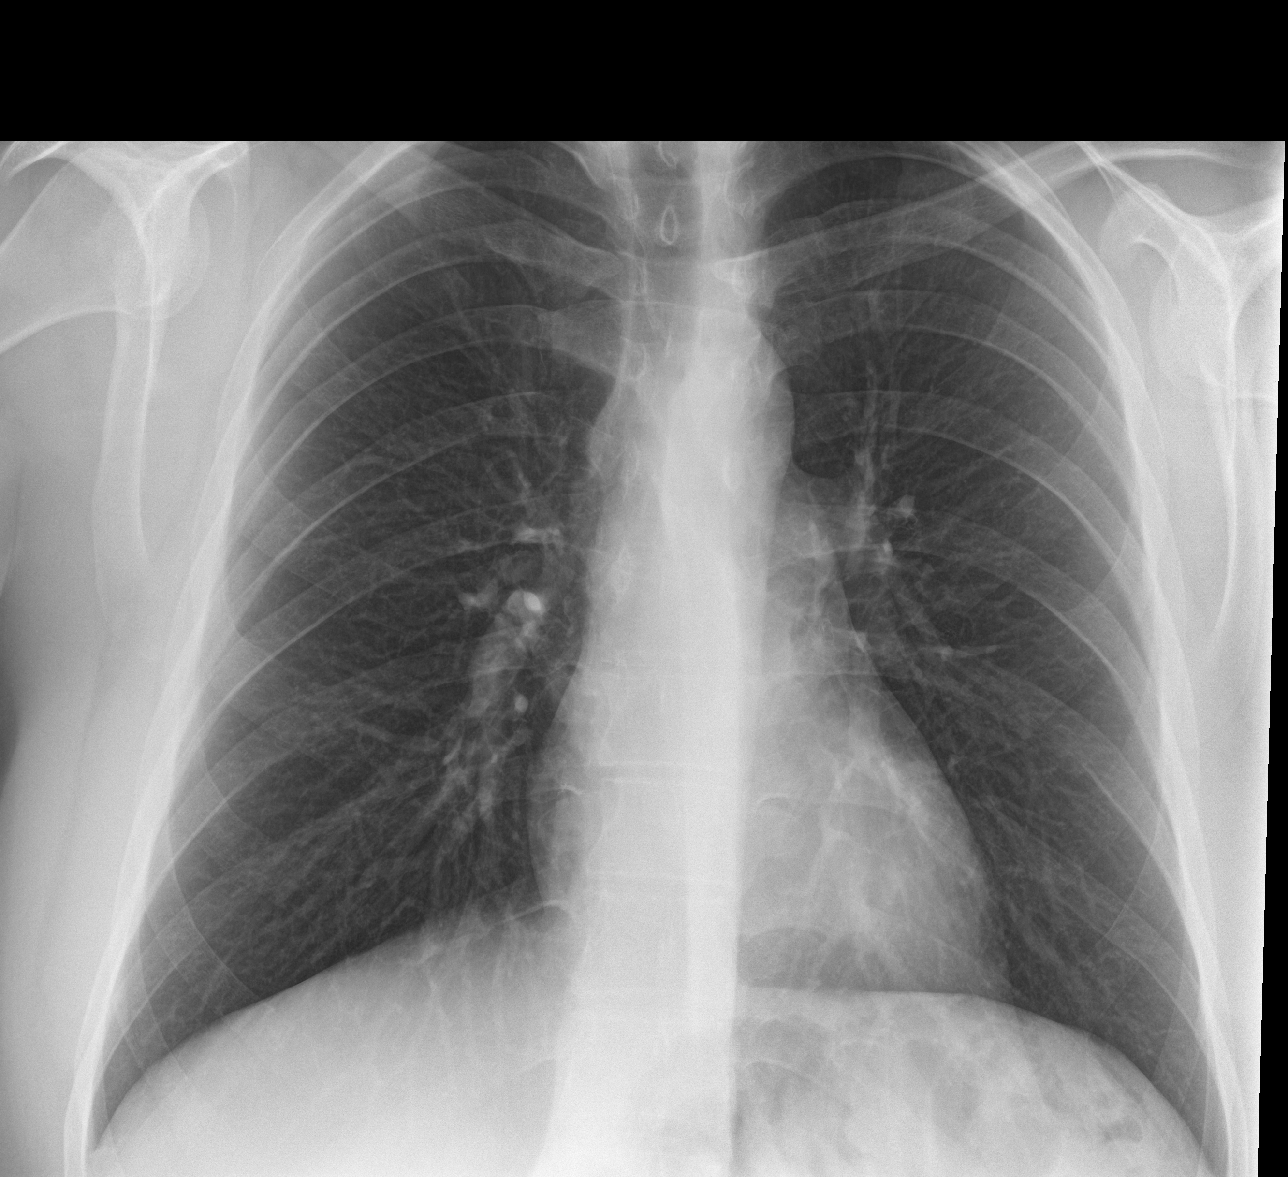

[chest lat (1 of 2)]
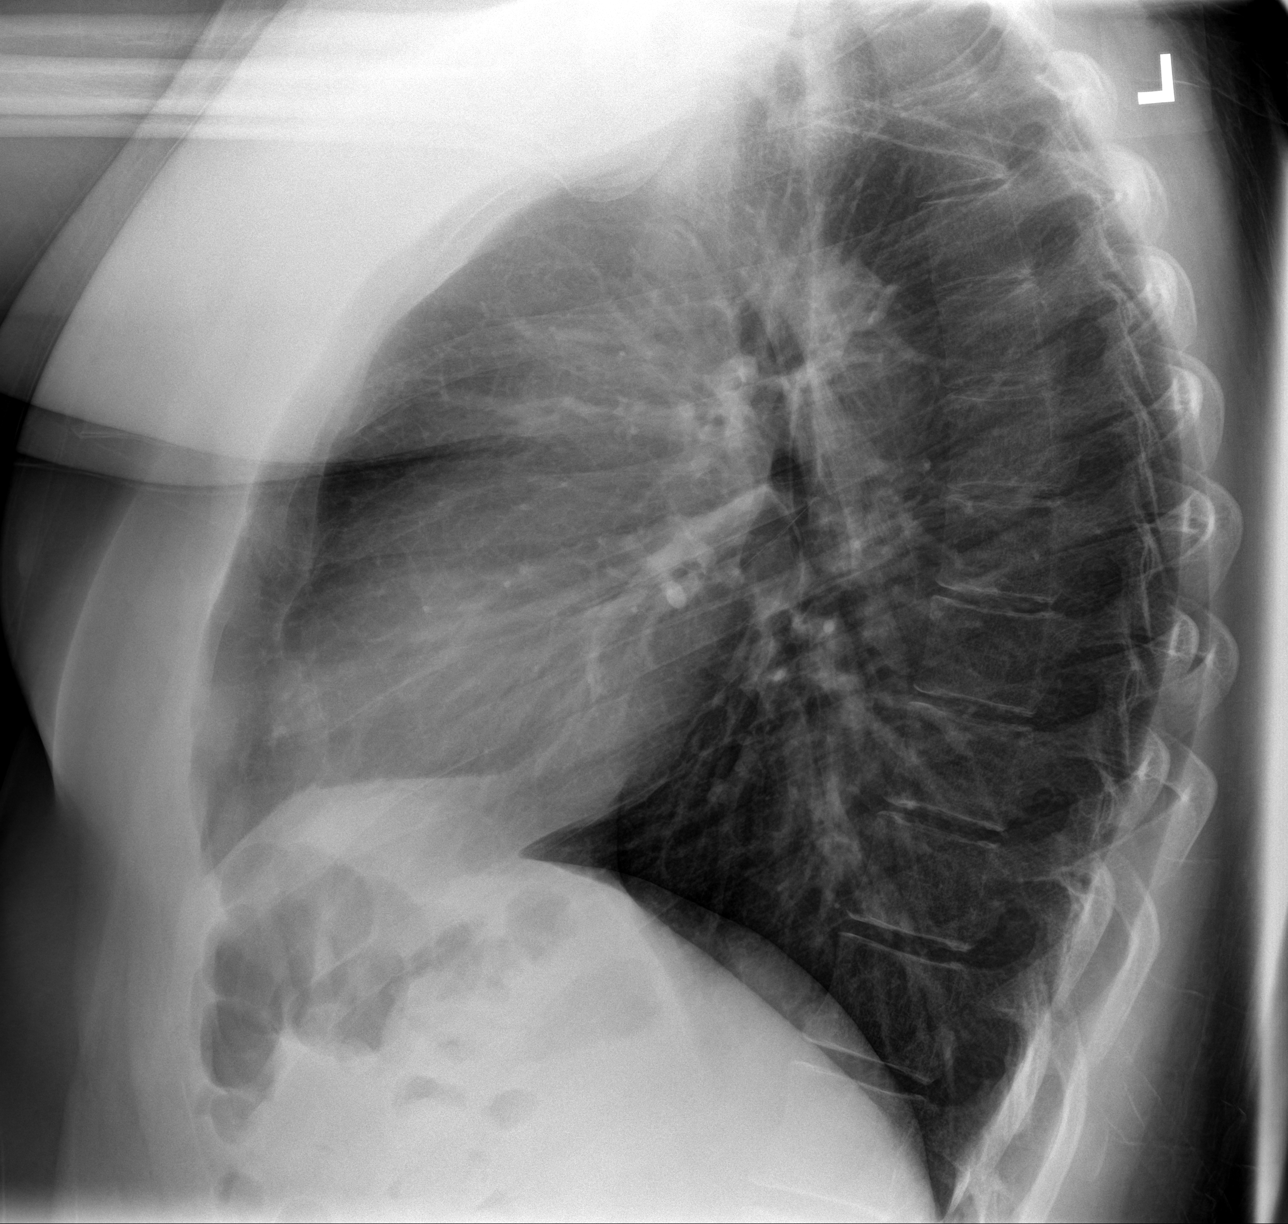

[chest lat (2 of 2)]
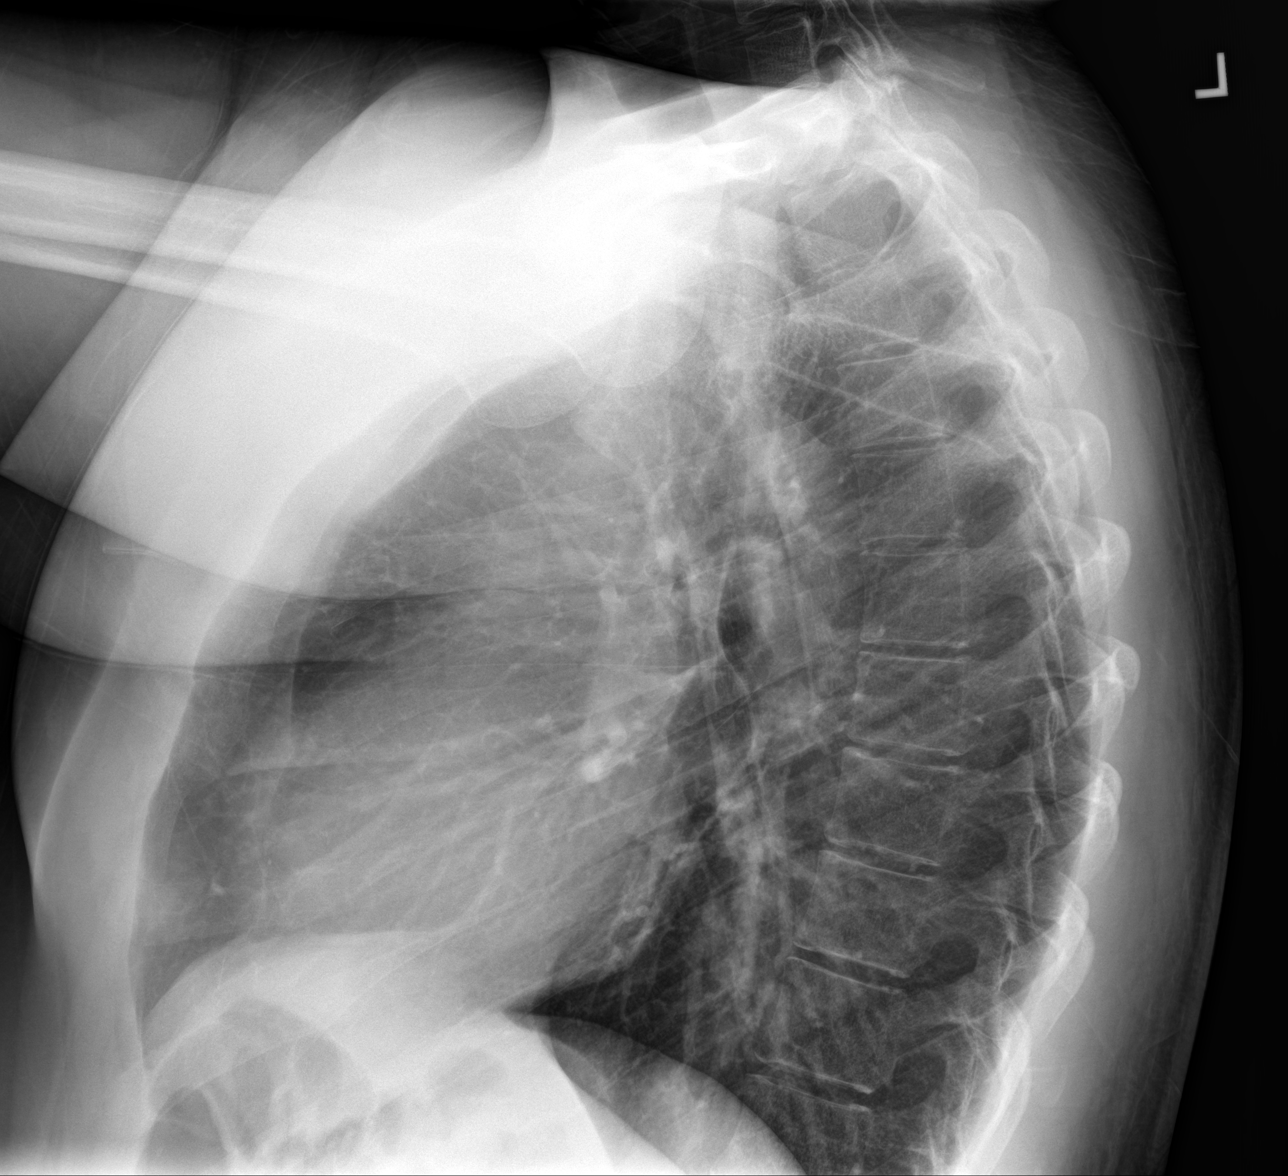

[3 of 3 positions shown; findings below may reference images not displayed]

FINDINGS: The heart size and mediastinal contours are within normal limits.
Both lungs are clear. The visualized skeletal structures are
unremarkable.
IMPRESSION: No active cardiopulmonary disease.

## 2023-01-13 ENCOUNTER — Telehealth: Payer: Self-pay

## 2023-01-13 NOTE — Transitions of Care (Post Inpatient/ED Visit) (Signed)
   01/13/2023  Name: Corey Malone. MRN: [id number] DOB: 2000/08/19  Today's TOC FU Call Status: Today's TOC FU Call Status:: Successful TOC FU Call Competed TOC FU Call Complete Date: 01/13/23  Transition Care Management Follow-up Telephone Call Date of Discharge: 01/10/23 Discharge Facility: [facility]) Type of Discharge: Emergency Department Reason for ED Visit: Other: How have you been since you were released from the hospital?: Better Any questions or concerns?: No  Items Reviewed: Did you receive and understand the discharge instructions provided?: No Medications obtained,verified, and reconciled?: Yes (Medications Reviewed) Any new allergies since your discharge?: No Dietary orders reviewed?: No Do you have support at home?: No  Medications Reviewed Today: Medications Reviewed Today   Medications were not reviewed in this encounter     Home Care and Equipment/Supplies: Were Home Health Services Ordered?: No Any new equipment or medical supplies ordered?: No  Functional Questionnaire: Do you need assistance with bathing/showering or dressing?: No Do you need assistance with meal preparation?: No Do you need assistance with eating?: No Do you have difficulty maintaining continence: No Do you need assistance with getting out of bed/getting out of a chair/moving?: No Do you have difficulty managing or taking your medications?: No  Follow up appointments reviewed: PCP Follow-up appointment confirmed?: No MD Provider Line [phone] Given: Yes Specialist Hospital Follow-up appointment confirmed?: No Do you need transportation to your follow-up appointment?: No Do you understand care options if your condition(s) worsen?: Yes-patient verbalized understanding    Corey Malone [facility]  Primary Care & Sports Medicine at [facility] CMA, AAMA [address]  [place] [address] Office [phone]   Fax: [phone]
